# Patient Record
Sex: Male | Born: 2000 | Race: Black or African American | Hispanic: No | Marital: Single | Smoking: Current some day smoker
Health system: Southern US, Community
[De-identification: ages and names within clinical notes are randomized; demographics above are authoritative.]

## PROBLEM LIST (undated history)

## (undated) DIAGNOSIS — T7840XA Allergy, unspecified, initial encounter: Secondary | ICD-10-CM

## (undated) DIAGNOSIS — F909 Attention-deficit hyperactivity disorder, unspecified type: Secondary | ICD-10-CM

## (undated) DIAGNOSIS — J302 Other seasonal allergic rhinitis: Secondary | ICD-10-CM

## (undated) HISTORY — DX: Allergy, unspecified, initial encounter: T78.40XA

---
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
## 2021-01-02 ENCOUNTER — Encounter: Payer: Self-pay | Admitting: Family

## 2021-01-09 ENCOUNTER — Encounter: Payer: Self-pay | Admitting: Family

## 2021-02-05 ENCOUNTER — Other Ambulatory Visit: Payer: Self-pay | Admitting: Family

## 2021-02-05 DIAGNOSIS — F902 Attention-deficit hyperactivity disorder, combined type: Secondary | ICD-10-CM

## 2021-03-23 ENCOUNTER — Telehealth: Payer: 59 | Admitting: Family

## 2021-05-25 ENCOUNTER — Telehealth: Payer: 59 | Admitting: Family

## 2023-03-10 ENCOUNTER — Other Ambulatory Visit: Payer: Self-pay

## 2023-03-10 ENCOUNTER — Ambulatory Visit (HOSPITAL_COMMUNITY)
Admission: EM | Admit: 2023-03-10 | Discharge: 2023-03-10 | Disposition: A | Discharge status: Home / Self Care | Payer: Managed Care, Other (non HMO) | Attending: Physician Assistant | Admitting: Physician Assistant

## 2023-03-10 ENCOUNTER — Encounter (HOSPITAL_COMMUNITY): Payer: Self-pay | Admitting: *Deleted

## 2023-03-10 DIAGNOSIS — B349 Viral infection, unspecified: Secondary | ICD-10-CM | POA: Insufficient documentation

## 2023-03-10 DIAGNOSIS — Z1152 Encounter for screening for COVID-19: Secondary | ICD-10-CM | POA: Diagnosis not present

## 2023-03-10 DIAGNOSIS — R112 Nausea with vomiting, unspecified: Secondary | ICD-10-CM | POA: Diagnosis present

## 2023-03-10 DIAGNOSIS — R509 Fever, unspecified: Secondary | ICD-10-CM | POA: Diagnosis present

## 2023-03-10 LAB — POCT INFLUENZA A/B
Influenza A, POC: NEGATIVE
Influenza B, POC: NEGATIVE

## 2023-03-10 LAB — POCT RAPID STREP A (OFFICE): Rapid Strep A Screen: NEGATIVE

## 2023-03-10 MED ORDER — ACETAMINOPHEN 325 MG PO TABS
ORAL_TABLET | ORAL | Status: AC
  Filled 2023-03-10: qty 3

## 2023-03-10 MED ORDER — ACETAMINOPHEN 325 MG PO TABS
975.0000 mg | ORAL_TABLET | Freq: Once | ORAL | Status: AC
  Administered 2023-03-10: 975 mg via ORAL

## 2023-03-10 MED ORDER — IBUPROFEN 100 MG/5ML PO SUSP
ORAL | Status: AC
  Filled 2023-03-10: qty 20

## 2023-03-10 MED ORDER — IBUPROFEN 100 MG/5ML PO SUSP
400.0000 mg | Freq: Once | ORAL | Status: AC
  Administered 2023-03-10: 400 mg via ORAL

## 2023-03-10 MED ORDER — ONDANSETRON 4 MG PO TBDP: 4.0000 mg | ORAL_TABLET | Freq: Three times a day (TID) | ORAL | 0 refills | Status: AC | PRN

## 2023-03-10 NOTE — ED Provider Notes (Signed)
[facility]    CSN: [id number] Arrival date & time: 03/10/23  1944      History   Chief Complaint Chief Complaint  Patient presents with   Headache   Emesis   Fever   Generalized Body Aches    HPI Mike Pacheco is a 22 y.o. male.   Patient presents today with a several day history of fever, body aches, headache, nausea/vomiting.  Denies any abdominal pain, diarrhea, cough, congestion, sore throat.  Reports that nausea and vomiting occurred when symptoms began but have not happened more recently.  He has been given a few doses of ibuprofen with last dose yesterday.  Denies any known sick contacts.  He has not had COVID.  He is up-to-date on age-appropriate immunizations.  He does have a history of allergies but has not been taking medication to manage this regularly.  He does not smoke.  Denies history of asthma but does have Qvar and albuterol listed on chart though he has not been taking this regularly.  Denies any recent antibiotics or steroids.  He has been able to eat and drink despite symptoms.    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 No pertinent surgical history.     Home Medications    Prior to Admission medications   Medication Sig Start Date End Date Taking? Authorizing Provider   ondansetron (ZOFRAN-ODT) 4 MG disintegrating tablet Take 1 tablet (4 mg total) by mouth every 8 (eight) hours as needed for nausea or vomiting. 03/10/23  Yes Kynsley Whitehouse K, PA-C  acetaminophen (TYLENOL) 325 MG tablet Take 2 tablets (650 mg total) by mouth every 6 (six) hours as needed. Patient not taking: Reported on 09/21/2019 08/21/16   Sherrilee Gilles, NP  adapalene (DIFFERIN) 0.1 % cream 1 APPLICATION APPLY ON THE SKIN AT BEDTIME 04/23/16   [provider]  albuterol (VENTOLIN HFA) 108 (90 Base) MCG/ACT inhaler Inhale into the lungs. 06/10/19   [provider]  beclomethasone (QVAR REDIHALER) 40 MCG/ACT inhaler Inhale into the lungs. 07/24/17   [provider]  cetirizine (ZYRTEC) 10 MG tablet TAKE 1 TABLET BY MOUTH EVERY DAY FOR RUNNY NOSE OR ITCHING 02/11/19   [provider]  cloNIDine (CATAPRES) 0.2 MG tablet Take 1 tablet (0.2 mg total) by mouth 2 (two) times daily. 10/25/20   Crump, Bobi A, NP  escitalopram (LEXAPRO) 5 MG tablet Take 1 tablet (5 mg total) by mouth 2 (two) times daily. 09/14/20   Paretta-Leahey, Miachel Roux, NP  fluticasone (FLONASE) 50 MCG/ACT nasal spray Place 2 sprays into the nose daily. (1 spray per nostril at bedtime) 09/24/12   Whitaker, Ethelda Chick, NP  ibuprofen (ADVIL,MOTRIN) 600 MG tablet Take 1 tablet (600 mg total) by mouth every 6 (six) hours as needed. Patient not taking: Reported on 12/24/2019 08/21/16   Sherrilee Gilles,  NP  PROAIR HFA 108 (90 Base) MCG/ACT inhaler Inhale 90 mcg into the lungs as needed. 01/25/16   [provider]  triamcinolone ointment (KENALOG) 0.1 % Apply topically. 07/24/17   [provider]  VYVANSE 50 MG capsule Take 1 capsule (50 mg total) by mouth 2 (two) times daily. 12/27/20   Paretta-Leahey, Miachel Roux, NP    Family History Family History  Problem Relation Age of Onset   Diabetes Mother    Hypertension Mother    Depression Mother    Alcohol abuse Father    Diabetes Maternal  Grandfather    Cancer Maternal Grandfather     Social History Social History   Tobacco Use   Smoking status: Never   Smokeless tobacco: Never  Substance Use Topics   Alcohol use: Not Currently    Alcohol/week: 0.0 standard drinks of alcohol   Drug use: Yes    Types: Marijuana     Allergies   Patient has no known allergies.   Review of Systems Review of Systems  Constitutional:  Positive for activity change, fatigue and fever. Negative for appetite change.  HENT:  Negative for congestion, sinus pressure, sneezing and sore throat.   Respiratory:  Negative for cough and shortness of breath.   Cardiovascular:  Negative for chest pain.  Gastrointestinal:  Positive for nausea and vomiting. Negative for abdominal pain and diarrhea.  Musculoskeletal:  Positive for arthralgias and myalgias.  Neurological:  Positive for headaches. Negative for dizziness and light-headedness.     Physical Exam Triage Vital Signs ED Triage Vitals  Enc Vitals Group     BP 03/10/23 1956 117/71     Pulse Rate 03/10/23 1956 (!) 101     Resp 03/10/23 1956 20     Temp 03/10/23 1956 (!) 102.9 F (39.4 C)     Temp src --      SpO2 03/10/23 1956 96 %     Weight --      Height --      Head Circumference --      Peak Flow --      Pain Score 03/10/23 1958 10     Pain Loc --      Pain Edu? --      Excl. in GC? --    No data found.  Updated Vital Signs BP 117/71   Pulse (!) 109   Temp (!) 102.7 F (39.3 C)   Resp 18   SpO2 95%   Visual Acuity Right Eye Distance:   Left Eye Distance:   Bilateral Distance:    Right Eye Near:   Left Eye Near:    Bilateral Near:     Physical Exam Vitals reviewed.  Constitutional:      General: He is awake.     Appearance: Normal appearance. He is well-developed. He is not ill-appearing.     Comments: Very pleasant male appears stated age in no acute distress  HENT:     Head: Normocephalic and atraumatic.     Right Ear: Tympanic membrane, ear canal  and external ear normal. Tympanic membrane is not erythematous or bulging.     Left Ear: Tympanic membrane, ear canal and external ear normal. Tympanic membrane is not erythematous or bulging.     Nose: Nose normal.     Mouth/Throat:     Pharynx: Uvula midline. Posterior oropharyngeal erythema present. No oropharyngeal exudate.     Tonsils: No tonsillar exudate.  Cardiovascular:     Rate and Rhythm: Normal  rate and regular rhythm.     Heart sounds: Normal heart sounds, S1 normal and S2 normal. No murmur heard. Pulmonary:     Effort: Pulmonary effort is normal. No accessory muscle usage or respiratory distress.     Breath sounds: Normal breath sounds. No stridor. No wheezing, rhonchi or rales.     Comments: Clear to auscultation bilaterally Abdominal:     General: Bowel sounds are normal.     Palpations: Abdomen is soft.     Tenderness: There is no abdominal tenderness. There is no right CVA tenderness, left CVA tenderness, guarding or rebound.     Comments: Benign abdominal exam  Neurological:     Mental Status: He is alert.  Psychiatric:        Behavior: Behavior is cooperative.      UC Treatments / Results  Labs (all labs ordered are listed, but only abnormal results are displayed) Labs Reviewed  SARS CORONAVIRUS 2 (TAT 6-24 HRS)  CULTURE, GROUP A STREP [facility])  POCT INFLUENZA A/B  POCT RAPID STREP A (OFFICE)    EKG   Radiology No results found.  Procedures Procedures (including critical care time)  Medications Ordered in UC Medications  acetaminophen (TYLENOL) tablet 975 mg (975 mg Oral Given 03/10/23 2005)  ibuprofen (ADVIL) 100 MG/5ML suspension 400 mg (400 mg Oral Given 03/10/23 2005)    Initial Impression / Assessment and Plan / UC Course  I have reviewed the triage vital signs and the nursing notes.  Pertinent labs & imaging results that were available during my care of the patient were reviewed by me and considered in my medical decision making (see chart  for details).     Patient was initially febrile and mildly tachycardic but otherwise well appearing and non-toxic.  Strep testing was negative.  Flu testing was negative.  COVID test is pending.  Patient is young is otherwise healthy so not a candidate for antiviral therapy if positive for COVID.  Strep culture was obtained and is pending but we will defer antibiotics until results are available.  No evidence of acute infection on physical exam that warrant initiation of antibiotics.  Patient was given Zofran to help manage nausea symptoms.  His vital signs and physical exam are reassuring with no indication for emergent evaluation.  We discussed that if he does not have resolution of symptoms within a few days he should return here or see his PCP for additional testing including lab work.  This was deferred today as he is only been symptomatic for a few days and suspect this is likely related to a viral illness.  He was encouraged to rest and drink plenty of fluid.  Discussed that if he has any worsening or changing symptoms he needs to be seen immediately including high fever not going to antipyretics, cough, shortness of breath, nausea/vomiting interfering with oral intake.  Strict return precautions given.  Work excuse note provided.  Final Clinical Impressions(s) / UC Diagnoses   Final diagnoses:  Viral illness  Fever, unspecified  Nausea and vomiting, unspecified vomiting type     Discharge Instructions      You tested negative for strep and flu.  We will contact you if you are positive for COVID.  Make sure that you rest and drink plenty of fluid.  Use Zofran for nausea and vomiting.  If your symptoms do not improve within a few days please return here or see your primary care for additional workup.  If you develop any worsening  symptoms including high fever not respond to medication, chest pain, shortness of breath, nausea/vomiting interfering with oral intake, severe abdominal pain you  need to be seen immediately.     ED Prescriptions     Medication Sig Dispense Auth. Provider   ondansetron (ZOFRAN-ODT) 4 MG disintegrating tablet Take 1 tablet (4 mg total) by mouth every 8 (eight) hours as needed for nausea or vomiting. 20 tablet Jerri Hargadon, Noberto Retort, PA-C      PDMP not reviewed this encounter.   Jeani Hawking, PA-C 03/10/23 2029

## 2023-03-10 NOTE — Discharge Instructions (Signed)
You tested negative for strep and flu.  We will contact you if you are positive for COVID.  Make sure that you rest and drink plenty of fluid.  Use Zofran for nausea and vomiting.  If your symptoms do not improve within a few days please return here or see your primary care for additional workup.  If you develop any worsening symptoms including high fever not respond to medication, chest pain, shortness of breath, nausea/vomiting interfering with oral intake, severe abdominal pain you need to be seen immediately.

## 2023-03-10 NOTE — ED Triage Notes (Signed)
Pt reports body aches,vomiting ,fever and HA started on SAt.

## 2023-03-11 LAB — CULTURE, GROUP A STREP (THRC)

## 2023-03-11 LAB — SARS CORONAVIRUS 2 (TAT 6-24 HRS): SARS Coronavirus 2: NEGATIVE

## 2023-03-12 LAB — CULTURE, GROUP A STREP (THRC)

## 2023-11-12 ENCOUNTER — Ambulatory Visit (HOSPITAL_COMMUNITY)
Admission: EM | Admit: 2023-11-12 | Discharge: 2023-11-12 | Disposition: A | Discharge status: Home / Self Care | Payer: Managed Care, Other (non HMO) | Attending: Emergency Medicine | Admitting: Emergency Medicine

## 2023-11-12 ENCOUNTER — Encounter (HOSPITAL_COMMUNITY): Payer: Self-pay | Admitting: Emergency Medicine

## 2023-11-12 ENCOUNTER — Other Ambulatory Visit: Payer: Self-pay

## 2023-11-12 DIAGNOSIS — J069 Acute upper respiratory infection, unspecified: Secondary | ICD-10-CM

## 2023-11-12 LAB — POC COVID19/FLU A&B COMBO
Covid Antigen, POC: NEGATIVE
Influenza A Antigen, POC: NEGATIVE
Influenza B Antigen, POC: NEGATIVE

## 2023-11-12 MED ORDER — ALBUTEROL SULFATE HFA 108 (90 BASE) MCG/ACT IN AERS: 1.0000 | INHALATION_SPRAY | Freq: Four times a day (QID) | RESPIRATORY_TRACT | 0 refills | Status: AC | PRN

## 2023-11-12 NOTE — ED Provider Notes (Signed)
 [facility]    CSN: [id number] Arrival date & time: 11/12/23  8082      History   Chief Complaint Chief Complaint  Patient presents with   Cough    HPI Mike Pacheco is a 23 y.o. male.   Patient presents to clinic for cough and nasal congestion that has been persistent for the past 2 days.  He started with chills and rhinorrhea.  Cough is productive with yellow-green phlegm.  He is started to have body aches and a headache today.  He has tried NyQuil.  Denies any fevers.  No vomiting or diarrhea.  His significant other and child are sick with similar symptoms.  He does have a history of asthma, does not have an albuterol  inhaler.  Denies any wheezing or shortness of breath.  He does smoke black and milds 1-2 times daily.  The history is provided by the patient and medical records.  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 No pertinent surgical history.     Home Medications    Prior to Admission medications   Medication Sig Start Date End Date Taking? Authorizing Provider  albuterol  (VENTOLIN  HFA) 108 (90 Base) MCG/ACT inhaler Inhale 1-2 puffs into the lungs every 6 (six) hours as needed for wheezing or shortness of breath. 11/12/23  Yes Lucianna Ostlund  N, FNP  acetaminophen  (TYLENOL ) 325  MG tablet Take 2 tablets (650 mg total) by mouth every 6 (six) hours as needed. Patient not taking: Reported on 09/21/2019 08/21/16   Everlean Laymon [name], NP  adapalene (DIFFERIN) 0.1 % cream 1 APPLICATION APPLY ON THE SKIN AT BEDTIME 04/23/16   [provider]  beclomethasone (QVAR REDIHALER) 40 MCG/ACT inhaler Inhale into the lungs. 07/24/17   [provider]  cetirizine  (ZYRTEC ) 10 MG tablet TAKE 1 TABLET BY MOUTH EVERY DAY FOR RUNNY NOSE OR ITCHING 02/11/19   [provider]  cloNIDine  (CATAPRES ) 0.2 MG tablet Take 1 tablet (0.2 mg total) by mouth 2 (two) times daily. 10/25/20   Crump, Bobi A, NP  escitalopram  (LEXAPRO ) 5 MG tablet Take 1 tablet (5 mg total) by mouth 2 (two) times daily. 09/14/20   Paretta-Leahey, Stephane HERO, NP  fluticasone  (FLONASE ) 50 MCG/ACT nasal spray Place 2 sprays into the nose daily. (1 spray per nostril at bedtime) 09/24/12   Whitaker, Rocky ORN, NP  ibuprofen  (ADVIL ,MOTRIN ) 600 MG tablet Take 1 tablet (600 mg total) by mouth every 6 (six) hours as needed. Patient not taking: Reported on 12/24/2019 08/21/16   Everlean Laymon [name], NP  ondansetron  (ZOFRAN -ODT) 4 MG disintegrating tablet Take 1 tablet (4 mg total) by mouth every 8 (eight) hours as needed for nausea or vomiting. Patient not taking: Reported on 11/12/2023 03/10/23   Raspet, Rocky POUR, PA-C  PROAIR  HFA 108 (90 Base) MCG/ACT inhaler Inhale 90 mcg into  the lungs as needed. 01/25/16   [provider]  triamcinolone ointment (KENALOG) 0.1 % Apply topically. 07/24/17   [provider]  VYVANSE  50 MG capsule Take 1 capsule (50 mg total) by mouth 2 (two) times daily. 12/27/20   Paretta-Leahey, Stephane HERO, NP    Family History Family History  Problem Relation Age of Onset   Diabetes Mother    Hypertension Mother    Depression Mother    Alcohol abuse Father    Diabetes Maternal Grandfather    Cancer Maternal Grandfather     Social History Social History   Tobacco Use   Smoking  status: Some Days    Types: Cigars   Smokeless tobacco: Never  Vaping Use   Vaping status: Never Used  Substance Use Topics   Alcohol use: Not Currently    Alcohol/week: 0.0 standard drinks of alcohol   Drug use: Yes    Types: Marijuana     Allergies   Patient has no known allergies.   Review of Systems Review of Systems  Per HPI  Physical Exam Triage Vital Signs ED Triage Vitals  Encounter Vitals Group     BP 11/12/23 2001 124/68     Systolic BP Percentile --      Diastolic BP Percentile --      Pulse Rate 11/12/23 2001 (!) 103     Resp 11/12/23 2001 20     Temp 11/12/23 2001 98.4 F (36.9 C)     Temp Source 11/12/23 2001 Oral     SpO2 11/12/23 2001 96 %     Weight --      Height --      Head Circumference --      Peak Flow --      Pain Score 11/12/23 1959 7     Pain Loc --      Pain Education --      Exclude from Growth Chart --    No data found.  Updated Vital Signs BP 124/68 (BP Location: Right Arm)   Pulse (!) 103   Temp 98.4 F (36.9 C) (Oral)   Resp 20   SpO2 96%   Visual Acuity Right Eye Distance:   Left Eye Distance:   Bilateral Distance:    Right Eye Near:   Left Eye Near:    Bilateral Near:     Physical Exam Vitals and nursing note reviewed.  Constitutional:      Appearance: Normal appearance.  HENT:     Head: Normocephalic and atraumatic.     Right Ear: External ear normal.     Left Ear: External ear normal.     Nose: Congestion and rhinorrhea present.     Mouth/Throat:     Mouth: Mucous membranes are moist.     Pharynx: Posterior oropharyngeal erythema present.  Eyes:     Conjunctiva/sclera: Conjunctivae normal.  Cardiovascular:     Rate and Rhythm: Normal rate and regular rhythm.     Heart sounds: Normal heart sounds. No murmur heard. Pulmonary:     Effort: Pulmonary effort is normal. No respiratory distress.     Breath sounds: Normal breath sounds.  Musculoskeletal:        General: Normal range of motion.  Skin:     General: Skin is warm and dry.     Capillary Refill: Capillary refill takes less than 2 seconds.  Neurological:     General: No focal deficit present.     Mental Status: He is alert and  oriented to person, place, and time.  Psychiatric:        Mood and Affect: Mood normal.        Behavior: Behavior normal.      UC Treatments / Results  Labs (all labs ordered are listed, but only abnormal results are displayed) Labs Reviewed  POC COVID19/FLU A&B COMBO    EKG   Radiology No results found.  Procedures Procedures (including critical care time)  Medications Ordered in UC Medications - No data to display  Initial Impression / Assessment and Plan / UC Course  I have reviewed the triage vital signs and the nursing notes.  Pertinent labs & imaging results that were available during my care of the patient were reviewed by me and considered in my medical decision making (see chart for details).  Vitals and triage reviewed, patient is hemodynamically stable.  Lungs are vesicular, heart with regular rate and rhythm.  Viral testing for COVID and flu is negative in clinic.  Body aches, headache and cough consistent with a viral URI, symptomatic management encouraged.  No wheezing auscultated, will refill albuterol  inhaler in case asthma flares up.  Plan of care, follow-up care return precautions given, no questions at this time.  Work note provided.     Final Clinical Impressions(s) / UC Diagnoses   Final diagnoses:  Viral URI with cough     Discharge Instructions      Your COVID and flu testing were negative, this is most likely a different viral upper respiratory tract infection.  Please use your albuterol  inhaler if you develop any shortness of breath or wheezing.  Alternate between 600 mg of ibuprofen  and 500 mg of Tylenol  every 4-6 hours for any fever, body aches or chills.  You can take over-the-counter Robitussin as needed for coughing.    Symptoms should improve over the  next 7 days or so, if no improvement or any changes please return to clinic or follow-up with your primary care provider.      ED Prescriptions     Medication Sig Dispense Auth. Provider   albuterol  (VENTOLIN  HFA) 108 (90 Base) MCG/ACT inhaler Inhale 1-2 puffs into the lungs every 6 (six) hours as needed for wheezing or shortness of breath. 18 g Dreama Renaee [name], FNP      PDMP not reviewed this encounter.   Dreama, Kiran Lapine  N, FNP 11/12/23 2030

## 2023-11-12 NOTE — ED Triage Notes (Signed)
 Cough and congestion for 2 days.  Having chills.  Patient has a runny nose.  Reports yellow-green phlegm with coughing.    Patient has tried nyquil "and stuff like that".

## 2023-11-12 NOTE — Discharge Instructions (Addendum)
 Your COVID and flu testing were negative, this is most likely a different viral upper respiratory tract infection.  Please use your albuterol  inhaler if you develop any shortness of breath or wheezing.  Alternate between 600 mg of ibuprofen  and 500 mg of Tylenol  every 4-6 hours for any fever, body aches or chills.  You can take over-the-counter Robitussin as needed for coughing.    Symptoms should improve over the next 7 days or so, if no improvement or any changes please return to clinic or follow-up with your primary care provider.

## 2024-07-21 ENCOUNTER — Ambulatory Visit (HOSPITAL_COMMUNITY)
Admission: EM | Admit: 2024-07-21 | Discharge: 2024-07-21 | Disposition: A | Discharge status: Home / Self Care | Attending: Physician Assistant | Admitting: Physician Assistant

## 2024-07-21 ENCOUNTER — Encounter (HOSPITAL_COMMUNITY): Payer: Self-pay

## 2024-07-21 DIAGNOSIS — J4521 Mild intermittent asthma with (acute) exacerbation: Secondary | ICD-10-CM

## 2024-07-21 DIAGNOSIS — Z113 Encounter for screening for infections with a predominantly sexual mode of transmission: Secondary | ICD-10-CM

## 2024-07-21 LAB — HIV ANTIBODY (ROUTINE TESTING W REFLEX): HIV Screen 4th Generation wRfx: NONREACTIVE

## 2024-07-21 MED ORDER — PREDNISONE 20 MG PO TABS: 40.0000 mg | ORAL_TABLET | Freq: Every day | ORAL | 0 refills | Status: AC

## 2024-07-21 MED ORDER — ALBUTEROL SULFATE HFA 108 (90 BASE) MCG/ACT IN AERS
2.0000 | INHALATION_SPRAY | Freq: Once | RESPIRATORY_TRACT | Status: AC
  Administered 2024-07-21: 2 via RESPIRATORY_TRACT

## 2024-07-21 MED ORDER — ALBUTEROL SULFATE HFA 108 (90 BASE) MCG/ACT IN AERS
INHALATION_SPRAY | RESPIRATORY_TRACT | Status: AC
  Filled 2024-07-21: qty 6.7

## 2024-07-21 MED ORDER — PROMETHAZINE-DM 6.25-15 MG/5ML PO SYRP: 5.0000 mL | ORAL_SOLUTION | Freq: Two times a day (BID) | ORAL | 0 refills | Status: AC | PRN

## 2024-07-21 NOTE — Discharge Instructions (Addendum)
 I am concerned you have an asthma exacerbation.  Use albuterol  for 4 to 6 hours as needed.  Take Promethazine  DM for cough.  This will make you sleepy so do not drive or drink alcohol with taking it.  Take prednisone  40 mg for 5 days.  Do not take NSAIDs with this medication getting aspirin, ibuprofen /Advil , naproxen/Aleve.  If you are not feeling better in a week or if anything worsens and you have high fever, worsening cough, shortness of breath despite the medication you need to be seen immediately.  We will contact you if anything is positive.  If you develop any symptoms including abdominal pain, pelvic pain, penile discharge, genital lesion you should be seen immediately.  Abstain from sex until you receive the results.  Use a condom with each sexual encounter.

## 2024-07-21 NOTE — ED Provider Notes (Signed)
 [facility]    CSN: [id number] Arrival date & time: 07/21/24  1939      History   Chief Complaint Chief Complaint  Patient presents with   Cough   SEXUALLY TRANSMITTED DISEASE    HPI Mike Pacheco is a 23 y.o. male.   Patient presents today with several concerns.  His primary concern today is a 5 to 6-day history of URI symptoms including cough, chest tightness, shortness of breath.  He denies any fever, congestion, sore throat.  Denies any known sick contacts.  He has been taking DayQuil and NyQuil without improvement of symptoms.  He does have a history of seasonal allergies as well as asthma and has not been using his medications regularly.  Denies any recent antibiotics or steroids.  Denies any exposure to COVID and he has not had COVID himself in the past 90 days.  He is up-to-date on age-appropriate immunizations.  He does smoke but denies any diagnosis of COPD.  He has never been hospitalized for chronic lung condition.  In addition, he is requesting STI testing.  He has no specific and certain but would like to be screened since he is here.  He denies any symptoms including penile discharge, dysuria, genital lesion.  He is sexually active with male partners but does not consistently use condoms.  He denies any recent antibiotics.    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 No pertinent surgical history.     Home Medications    Prior to Admission medications   Medication Sig Start Date End Date Taking? Authorizing Provider  predniSONE  (DELTASONE ) 20 MG tablet Take 2 tablets (40 mg total) by mouth daily. 07/21/24  Yes Mikea Quadros, Rocky POUR, PA-C  promethazine -dextromethorphan (PROMETHAZINE -DM) 6.25-15 MG/5ML syrup Take 5 mLs by mouth 2 (two) times daily as needed for cough. 07/21/24  Yes [place] Dani K, PA-C  acetaminophen  (TYLENOL ) 325 MG tablet Take 2 tablets (650 mg total) by mouth every 6 (six) hours as needed. Patient not taking: Reported on 09/21/2019 08/21/16   Everlean Laymon [name], NP  adapalene (DIFFERIN) 0.1 % cream 1 APPLICATION APPLY ON THE SKIN AT BEDTIME 04/23/16   [provider]  albuterol  (VENTOLIN  HFA) 108 (90 Base) MCG/ACT inhaler Inhale 1-2 puffs into the lungs every 6 (six) hours as needed for wheezing or shortness of breath. 11/12/23   Dreama, [place]  N, FNP  beclomethasone (QVAR REDIHALER) 40 MCG/ACT inhaler Inhale into the lungs. 07/24/17   [provider]  cetirizine  (ZYRTEC ) 10 MG tablet TAKE 1 TABLET BY MOUTH EVERY DAY FOR RUNNY NOSE OR ITCHING 02/11/19   [provider]  cloNIDine  (CATAPRES ) 0.2 MG tablet Take 1 tablet (0.2 mg total) by mouth 2 (two) times daily. 10/25/20   Crump, Bobi A, NP  escitalopram  (LEXAPRO ) 5 MG tablet Take 1 tablet (5 mg total) by  mouth 2 (two) times daily. 09/14/20   Paretta-Leahey, Stephane HERO, NP  fluticasone  (FLONASE ) 50 MCG/ACT nasal spray Place 2 sprays into the nose daily. (1 spray per nostril at bedtime) 09/24/12   Whitaker, Rocky ORN, NP  ibuprofen  (ADVIL ,MOTRIN ) 600 MG tablet Take 1 tablet (600 mg total) by mouth every 6 (six) hours as needed. Patient not taking: Reported on 12/24/2019 08/21/16   Everlean Laymon [name], NP  ondansetron  (ZOFRAN -ODT) 4 MG disintegrating tablet Take 1 tablet (4 mg total) by mouth every 8  (eight) hours as needed for nausea or vomiting. Patient not taking: Reported on 11/12/2023 03/10/23   Riva Sesma, Rocky POUR, PA-C  PROAIR  HFA 108 (90 Base) MCG/ACT inhaler Inhale 90 mcg into the lungs as needed. 01/25/16   [provider]  triamcinolone ointment (KENALOG) 0.1 % Apply topically. 07/24/17   [provider]  VYVANSE  50 MG capsule Take 1 capsule (50 mg total) by mouth 2 (two) times daily. 12/27/20   Paretta-Leahey, Stephane HERO, NP    Family History Family History  Problem Relation Age of Onset   Diabetes Mother    Hypertension Mother    Depression Mother    Alcohol abuse Father    Diabetes Maternal Grandfather    Cancer Maternal Grandfather     Social History Social History   Tobacco Use   Smoking status: Some Days    Types: Cigars   Smokeless tobacco: Never  Vaping Use   Vaping status: Never Used  Substance Use Topics   Alcohol use: Not Currently    Comment: occaisonally   Drug use: Yes    Types: Marijuana     Allergies   Patient has no known allergies.   Review of Systems Review of Systems  Constitutional:  Negative for activity change, appetite change, fatigue and fever.  HENT:  Negative for congestion and sore throat.   Respiratory:  Positive for cough, chest tightness and shortness of breath.   Cardiovascular:  Negative for chest pain.  Gastrointestinal:  Negative for abdominal pain, diarrhea, nausea and vomiting.  Genitourinary:  Negative for frequency, genital sores, hematuria, penile discharge, penile pain and urgency.     Physical Exam Triage Vital Signs ED Triage Vitals  Encounter Vitals Group     BP 07/21/24 2021 110/71     Girls Systolic BP Percentile --      Girls Diastolic BP Percentile --      Boys Systolic BP Percentile --      Boys Diastolic BP Percentile --      Pulse Rate 07/21/24 2021 76     Resp 07/21/24 2021 16     Temp 07/21/24 2021 97.9 F (36.6 C)     Temp Source 07/21/24 2021 Oral     SpO2 07/21/24 2021 96 %      Weight --      Height --      Head Circumference --      Peak Flow --      Pain Score 07/21/24 2024 0     Pain Loc --      Pain Education --      Exclude from Growth Chart --    No data found.  Updated Vital Signs BP 110/71 (BP Location: Left Arm)   Pulse 76   Temp 97.9 F (36.6 C) (Oral)   Resp 16   SpO2 96%   Visual Acuity Right Eye Distance:   Left Eye Distance:   Bilateral Distance:    Right Eye Near:  Left Eye Near:    Bilateral Near:     Physical Exam Vitals reviewed.  Constitutional:      General: He is awake.     Appearance: Normal appearance. He is well-developed. He is not ill-appearing.     Comments: Very pleasant male appears stated age in no acute distress sitting comfortably in exam room  HENT:     Head: Normocephalic and atraumatic.     Right Ear: Tympanic membrane, ear canal and external ear normal. Tympanic membrane is not erythematous or bulging.     Left Ear: Tympanic membrane, ear canal and external ear normal. Tympanic membrane is not erythematous or bulging.     Nose: Nose normal.     Mouth/Throat:     Pharynx: Uvula midline. No oropharyngeal exudate, posterior oropharyngeal erythema or uvula swelling.  Cardiovascular:     Rate and Rhythm: Normal rate and regular rhythm.     Heart sounds: Normal heart sounds, S1 normal and S2 normal. No murmur heard. Pulmonary:     Effort: Pulmonary effort is normal. No accessory muscle usage or respiratory distress.     Breath sounds: No stridor. Examination of the right-lower field reveals decreased breath sounds. Examination of the left-lower field reveals decreased breath sounds. Decreased breath sounds present. No wheezing, rhonchi or rales.  Neurological:     Mental Status: He is alert.  Psychiatric:        Behavior: Behavior is cooperative.      UC Treatments / Results  Labs (all labs ordered are listed, but only abnormal results are displayed) Labs Reviewed  HIV ANTIBODY (ROUTINE TESTING W  REFLEX)  RPR  CYTOLOGY, (ORAL, ANAL, URETHRAL) ANCILLARY ONLY    EKG   Radiology No results found.  Procedures Procedures (including critical care time)  Medications Ordered in UC Medications  albuterol  (VENTOLIN  HFA) 108 (90 Base) MCG/ACT inhaler 2 puff (2 puffs Inhalation Given 07/21/24 2050)    Initial Impression / Assessment and Plan / UC Course  I have reviewed the triage vital signs and the nursing notes.  Pertinent labs & imaging results that were available during my care of the patient were reviewed by me and considered in my medical decision making (see chart for details).     Patient is well-appearing, afebrile, nontoxic, nontachycardic.  Viral testing was deferred as he has been symptomatic for over 5 days and this would not change our management.  Chest x-ray was deferred as decreased aeration improved following dose of albuterol  clinic and his oxygen saturation was appropriate.  Will treat for asthma exacerbation.  He was sent home with an albuterol  inhaler to be used every 4-6 hours as needed and will start prednisone  burst of 40 mg for 5 days.  We discussed that he is not to take NSAIDs with this medication but can use Tylenol , Mucinex, Flonase  for additional symptom relief.  He was given Promethazine  DM to help with his cough and we discussed that this can be sedating so he is up to drive drink alcohol taking it.  If his symptoms are improving within a week or if he has any worsening symptoms he needs to be seen immediately.  Strict turn precautions given.  Excuse note provided.  STI screening obtained today and pending.  He is to abstain from sex until he receives his results.  We discussed) Expectuss.  If he develops any symptoms he is to return for reevaluation.  We will contact him if we need to arrange any treatment based on  his results.  Final Clinical Impressions(s) / UC Diagnoses   Final diagnoses:  Mild intermittent asthma with acute exacerbation  Screening  examination for STI     Discharge Instructions      I am concerned you have an asthma exacerbation.  Use albuterol  for 4 to 6 hours as needed.  Take Promethazine  DM for cough.  This will make you sleepy so do not drive or drink alcohol with taking it.  Take prednisone  40 mg for 5 days.  Do not take NSAIDs with this medication getting aspirin, ibuprofen /Advil , naproxen/Aleve.  If you are not feeling better in a week or if anything worsens and you have high fever, worsening cough, shortness of breath despite the medication you need to be seen immediately.  We will contact you if anything is positive.  If you develop any symptoms including abdominal pain, pelvic pain, penile discharge, genital lesion you should be seen immediately.  Abstain from sex until you receive the results.  Use a condom with each sexual encounter.     ED Prescriptions     Medication Sig Dispense Auth. Provider   predniSONE  (DELTASONE ) 20 MG tablet Take 2 tablets (40 mg total) by mouth daily. 10 tablet Kamarri Fischetti K, PA-C   promethazine -dextromethorphan (PROMETHAZINE -DM) 6.25-15 MG/5ML syrup Take 5 mLs by mouth 2 (two) times daily as needed for cough. 50 mL Nhat Hearne K, PA-C      PDMP not reviewed this encounter.   Sherrell Rocky POUR, PA-C 07/21/24 2111

## 2024-07-21 NOTE — ED Triage Notes (Addendum)
 Patient here today with c/o cough, nasal congestion, wheeze, and SOB X 4-5 days. Patient states that the cough is worse at night. He has tried taking Nyquil and Dayquil with no relief. No known sick contacts.   Patient would also like to be tested for Stds. Denies symptoms.

## 2024-07-22 LAB — RPR: RPR Ser Ql: NONREACTIVE

## 2024-07-22 LAB — CYTOLOGY, (ORAL, ANAL, URETHRAL) ANCILLARY ONLY
Chlamydia: NEGATIVE
Comment: NEGATIVE
Comment: NEGATIVE
Comment: NORMAL
Neisseria Gonorrhea: NEGATIVE
Trichomonas: NEGATIVE
